# Patient Record
Sex: Male | Born: 1975 | Race: White | Hispanic: No | Marital: Married | Smoking: Former smoker
Health system: Southern US, Community
[De-identification: ages and names within clinical notes are randomized; demographics above are authoritative.]

## PROBLEM LIST (undated history)

## (undated) DIAGNOSIS — I639 Cerebral infarction, unspecified: Secondary | ICD-10-CM

## (undated) DIAGNOSIS — G709 Myoneural disorder, unspecified: Secondary | ICD-10-CM

## (undated) DIAGNOSIS — M1990 Unspecified osteoarthritis, unspecified site: Secondary | ICD-10-CM

## (undated) DIAGNOSIS — R011 Cardiac murmur, unspecified: Secondary | ICD-10-CM

## (undated) DIAGNOSIS — K219 Gastro-esophageal reflux disease without esophagitis: Secondary | ICD-10-CM

## (undated) DIAGNOSIS — N189 Chronic kidney disease, unspecified: Secondary | ICD-10-CM

## (undated) HISTORY — PX: OTHER SURGICAL HISTORY: SHX169

## (undated) HISTORY — DX: Gastro-esophageal reflux disease without esophagitis: K21.9

## (undated) HISTORY — DX: Chronic kidney disease, unspecified: N18.9

## (undated) HISTORY — DX: Myoneural disorder, unspecified: G70.9

## (undated) HISTORY — DX: Cerebral infarction, unspecified: I63.9

## (undated) HISTORY — PX: HERNIA REPAIR: SHX51

## (undated) HISTORY — PX: KNEE ARTHROSCOPY: SUR90

## (undated) HISTORY — DX: Unspecified osteoarthritis, unspecified site: M19.90

## (undated) HISTORY — PX: SPINE SURGERY: SHX786

## (undated) HISTORY — DX: Cardiac murmur, unspecified: R01.1

---

## 2011-03-01 ENCOUNTER — Other Ambulatory Visit: Payer: Self-pay | Admitting: Neurosurgery

## 2011-03-01 DIAGNOSIS — M5410 Radiculopathy, site unspecified: Secondary | ICD-10-CM

## 2011-03-01 DIAGNOSIS — M549 Dorsalgia, unspecified: Secondary | ICD-10-CM

## 2011-03-03 ENCOUNTER — Ambulatory Visit
Admission: RE | Admit: 2011-03-03 | Discharge: 2011-03-03 | Disposition: A | Discharge status: Home / Self Care | Payer: Medicare Other | Source: Ambulatory Visit | Attending: Neurosurgery | Admitting: Neurosurgery

## 2011-03-03 DIAGNOSIS — M549 Dorsalgia, unspecified: Secondary | ICD-10-CM

## 2011-03-03 DIAGNOSIS — M5410 Radiculopathy, site unspecified: Secondary | ICD-10-CM

## 2011-03-15 ENCOUNTER — Other Ambulatory Visit: Payer: Self-pay | Admitting: Neurosurgery

## 2011-03-15 DIAGNOSIS — M5416 Radiculopathy, lumbar region: Secondary | ICD-10-CM

## 2011-03-15 DIAGNOSIS — M549 Dorsalgia, unspecified: Secondary | ICD-10-CM

## 2011-03-17 ENCOUNTER — Ambulatory Visit
Admission: RE | Admit: 2011-03-17 | Discharge: 2011-03-17 | Disposition: A | Discharge status: Home / Self Care | Payer: Medicare Other | Source: Ambulatory Visit | Attending: Neurosurgery | Admitting: Neurosurgery

## 2011-03-17 DIAGNOSIS — M549 Dorsalgia, unspecified: Secondary | ICD-10-CM

## 2011-03-17 DIAGNOSIS — M5416 Radiculopathy, lumbar region: Secondary | ICD-10-CM

## 2011-05-13 ENCOUNTER — Ambulatory Visit (HOSPITAL_COMMUNITY)
Admission: RE | Admit: 2011-05-13 | Discharge: 2011-05-13 | Disposition: A | Discharge status: Home / Self Care | Payer: Medicare Other | Source: Ambulatory Visit | Attending: Neurosurgery | Admitting: Neurosurgery

## 2011-05-13 ENCOUNTER — Encounter (HOSPITAL_COMMUNITY)
Admission: RE | Admit: 2011-05-13 | Discharge: 2011-05-13 | Disposition: A | Discharge status: Home / Self Care | Payer: Medicare Other | Source: Ambulatory Visit | Attending: Neurosurgery | Admitting: Neurosurgery

## 2011-05-13 ENCOUNTER — Other Ambulatory Visit (HOSPITAL_COMMUNITY): Payer: Self-pay | Admitting: Neurosurgery

## 2011-05-13 DIAGNOSIS — Z01818 Encounter for other preprocedural examination: Secondary | ICD-10-CM | POA: Insufficient documentation

## 2011-05-13 DIAGNOSIS — R52 Pain, unspecified: Secondary | ICD-10-CM

## 2011-05-13 DIAGNOSIS — I10 Essential (primary) hypertension: Secondary | ICD-10-CM | POA: Insufficient documentation

## 2011-05-13 LAB — BASIC METABOLIC PANEL
BUN: 25 mg/dL — ABNORMAL HIGH (ref 6–23)
Calcium: 10.6 mg/dL — ABNORMAL HIGH (ref 8.4–10.5)
GFR calc Af Amer: 43 mL/min — ABNORMAL LOW (ref >60)
GFR calc non Af Amer: 35 mL/min — ABNORMAL LOW (ref >60)
Glucose, Bld: 84 mg/dL (ref 70–99)
Potassium: 4.6 mEq/L (ref 3.5–5.1)
Sodium: 141 mEq/L (ref 135–145)

## 2011-05-13 LAB — CBC
Hemoglobin: 14.6 g/dL (ref 13.0–17.0)
MCH: 31.2 pg (ref 26.0–34.0)
MCHC: 34.4 g/dL (ref 30.0–36.0)

## 2011-05-18 ENCOUNTER — Ambulatory Visit (HOSPITAL_COMMUNITY)
Admission: RE | Admit: 2011-05-18 | Discharge: 2011-05-19 | Disposition: A | Discharge status: Home / Self Care | Payer: Medicare Other | Source: Ambulatory Visit | Attending: Neurosurgery | Admitting: Neurosurgery

## 2011-05-18 ENCOUNTER — Observation Stay (HOSPITAL_COMMUNITY): Payer: Medicare Other

## 2011-05-18 ENCOUNTER — Ambulatory Visit (HOSPITAL_COMMUNITY): Admission: RE | Admit: 2011-05-18 | Payer: Medicare Other | Source: Ambulatory Visit | Admitting: Neurosurgery

## 2011-05-18 DIAGNOSIS — I10 Essential (primary) hypertension: Secondary | ICD-10-CM | POA: Insufficient documentation

## 2011-05-18 DIAGNOSIS — M79609 Pain in unspecified limb: Secondary | ICD-10-CM | POA: Insufficient documentation

## 2011-05-18 DIAGNOSIS — M5126 Other intervertebral disc displacement, lumbar region: Secondary | ICD-10-CM | POA: Insufficient documentation

## 2011-05-18 DIAGNOSIS — M47817 Spondylosis without myelopathy or radiculopathy, lumbosacral region: Secondary | ICD-10-CM | POA: Insufficient documentation

## 2011-05-18 DIAGNOSIS — M4120 Other idiopathic scoliosis, site unspecified: Secondary | ICD-10-CM | POA: Insufficient documentation

## 2011-05-18 DIAGNOSIS — Z01818 Encounter for other preprocedural examination: Secondary | ICD-10-CM | POA: Insufficient documentation

## 2011-05-18 DIAGNOSIS — F17200 Nicotine dependence, unspecified, uncomplicated: Secondary | ICD-10-CM | POA: Insufficient documentation

## 2011-05-18 DIAGNOSIS — M6250 Muscle wasting and atrophy, not elsewhere classified, unspecified site: Secondary | ICD-10-CM | POA: Insufficient documentation

## 2011-05-18 DIAGNOSIS — K219 Gastro-esophageal reflux disease without esophagitis: Secondary | ICD-10-CM | POA: Insufficient documentation

## 2011-05-18 DIAGNOSIS — J449 Chronic obstructive pulmonary disease, unspecified: Secondary | ICD-10-CM | POA: Insufficient documentation

## 2011-05-18 DIAGNOSIS — J4489 Other specified chronic obstructive pulmonary disease: Secondary | ICD-10-CM | POA: Insufficient documentation

## 2011-05-18 DIAGNOSIS — Z01812 Encounter for preprocedural laboratory examination: Secondary | ICD-10-CM | POA: Insufficient documentation

## 2011-05-20 NOTE — Op Note (Signed)
Clayton Livingston, Clayton Livingston                ACCOUNT NO.:  [id number]  MEDICAL RECORD NO.:  [id number]  LOCATION:  3599                         FACILITY:  [facility]  PHYSICIAN:  Hilda Lias, M.D.   DATE OF BIRTH:  08-29-1976  DATE OF PROCEDURE:  05/18/2011 DATE OF DISCHARGE:                              OPERATIVE REPORT   PREOPERATIVE DIAGNOSIS:  Right L2-L3 extraforaminal herniated disk with osteophyte and chronic radiculopathy with atrophy of the right quadriceps.  POSTOPERATIVE DIAGNOSIS:  Right L2-L3 extraforaminal herniated disk with osteophyte and chronic radiculopathy with atrophy of the right quadriceps.  PROCEDURE:  Right L2-L3 extraforaminal diskectomy, lysis of adhesion, removal of osteophyte, decompression of the L2 nerve root.  Microscope matrix.  SURGEON:  Hilda Lias, MD.  ASSISTANT:  Cristi Loron, MD.  CLINICAL HISTORY:  [name] is a gentleman who had been complaining of back pain worse in both and right leg pain for 10 years.  By the time I saw him, he has severe _wasted of the quadriceps_________ .  He has a bad case of scoliosis with degenerative disk disease.  X-rays showed multiple lesions, but the most important was a large herniated disk and a spondylosis of L2-L3 on the right side of the foramina.  The patient wanted to proceed with surgery.  DESCRIPTION OF PROCEDURE:  The patient was taken to the OR, and after intubation, he was positioned in prone manner.  The back was cleaned with DuraPrep.  Using the C-arm, we localized the area of L2-L3. Incision was made and dissection was carried down using the serial dilator down to the subcutaneous space, fascia, and muscle down to the L3-L4 space.  From then on, we took several x-rays just to be 100% sure. The main problem was, there was some mislabeling between the MRI from [facility] and the x-ray taken here in the OR.  What it was called L5 by x-ray, it was called S1 by MRI.  At the end, I talked  to the doctor on several occasion and then we agreed that the area where the problem was, was at the level of L2-3 based on the MRI and not in the x-ray taken in the OR.  That was probably the first abnormal disk counted from  below.  Nevertheless, after we went into the intertransverse space with the help of the microscope, we started to dissect the intertransverse muscle.  Immediately, we found that the L2 nerve root was flat, completely yellow, and really _pushed _________ anteriorly.  Dissection was carried out, and after we mobilized the L2 nerve root we found two things, one was a large osteophyte which had to be drilled out and after that, we found at least 4 fragments of herniated disk compromising the takeoff of the L2 nerve root and part of the thecal sac.  At the end, we had good decompression of the L2 nerve roots, good lateral diskectomy, with good lysis of adhesions.  Then, the area was irrigated. Surgifoam was left in the surgical area and the wound was closed with Vicryl and Steri-Strips.          ______________________________ Hilda Lias, M.D.     EB/MEDQ  D:  05/18/2011  T:  05/18/2011  Job:  [id number]  Electronically Signed by Hilda Lias M.D. on 05/20/2011 06:18:04 PM

## 2011-05-20 NOTE — H&P (Signed)
  NAMEJAELEN, SOTH                ACCOUNT NO.:  [id number]  MEDICAL RECORD NO.:  [id number]  LOCATION:  3599                         FACILITY:  [facility]  PHYSICIAN:  Hilda Lias, M.D.   DATE OF BIRTH:  Nov 17, 1975  DATE OF ADMISSION:  05/18/2011 DATE OF DISCHARGE:                             HISTORY & PHYSICAL   Mr. Routh is a gentleman who was seen in my office on several occasions starting on June of this year complaining of back pain radiation down to right knee, which is getting worse.  According to him, he told me that this problem has been going for more than 10 years and legs are getting painful, having borderline sensation.  He denies any pain in the left leg.  Pain is with walking up on the steps and also on standing.  He has an MRI and sent to [place] for evaluation.  PAST MEDICAL HISTORY:  He has a kidney transplant 3 times.  He is allergic to SULFA and _pnc_________.  He is taking Prograf, Norvasc, prednisone, Prilosec.  FAMILY HISTORY:  Sister has scoliosis.  SOCIAL HISTORY:  The patient does not smoke or drink.  REVIEW OF SYSTEMS:  He has a known history of kidney disease, having 3 kidney transplants.  He has a history of reflux, dizziness, pancreatitis, sinusitis, and tachycardia.  PHYSICAL EXAMINATION:  skin normal__________. EARS, NOSE, AND THROAT:  Normal. NECK:  Normal. LUNGS:  Clear. CARDIOVASCULAR:  Normal. ABDOMEN:  As described from previous surgery. MUSCULOSKELETAL:  __________. NEUROLOGIC:  He has _athrophy of the quadriceps_________.  There is absence of the right quadriceps reflex.  Numbness, which involve mostly the L2 area.  Reflexes symmetrical with absence of the right knee jerk.  Lumbar spine x-ray showed scoliosis compared to the right side, he has  scoliosis of L2-L3.  An MRI _showed a hnp at right l2l3_________ with an intraforaminal and extraforaminal disk.  CLINICAL IMPRESSION:  Scoliosis with right L2-L3 extraforaminal herniated disk.            ______________________________ Hilda Lias, M.D.     EB/MEDQ  D:  05/18/2011  T:  05/18/2011  Job:  [id number]  Electronically Signed by Hilda Lias M.D. on 05/20/2011 06:17:01 PM

## 2011-06-01 NOTE — Discharge Summary (Signed)
  NAMEHASHIM, EICHHORST                ACCOUNT NO.:  [id number]  MEDICAL RECORD NO.:  [id number]  LOCATION:  3534                         FACILITY:  [facility]  PHYSICIAN:  Hilda Lias, M.D.   DATE OF BIRTH:  01/15/1976  DATE OF ADMISSION:  05/18/2011 DATE OF DISCHARGE:  05/19/2011                              DISCHARGE SUMMARY   ADMISSION DIAGNOSES:  Right L2-L3 herniated disk with atrophy of the quadriceps.  Scoliosis.  FINAL DIAGNOSES:  Right L2-L3 herniated disk with atrophy of the quadriceps.  Scoliosis.  CLINICAL HISTORY:  The patient was admitted because of back pain raising to the right thigh associated with weakness of the quadriceps.  The patient says this problem has been going on for at least 2 years and he has a history of scoliosis.  Because of the findings, surgery was advised.  Laboratory normal.  COURSE IN THE HOSPITAL:  The patient was taken to surgery, right L2-L3 diskectomy was done.  The patient did well less than 24 hours, he wanted to go home.  CONDITION ON DISCHARGE:  Improvement.  MEDICATION:  Oxycodone, diazepam.  DIET:  Regular.  ACTIVITIES:  He is not to drive.  He is not to do any heavy lifting.  FOLLOWUP:  He will be seen in my office in 2 or 3 weeks.  He knows that if there is any problem he is to call me at anytime.          ______________________________ Hilda Lias, M.D.     EB/MEDQ  D:  05/20/2011  T:  05/21/2011  Job:  [id number]  Electronically Signed by Hilda Lias M.D. on 06/01/2011 07:19:32 PM

## 2012-04-16 ENCOUNTER — Encounter: Payer: Self-pay | Admitting: Physical Medicine & Rehabilitation

## 2012-05-03 ENCOUNTER — Ambulatory Visit: Payer: Medicare Other | Admitting: Physical Medicine & Rehabilitation

## 2012-05-14 ENCOUNTER — Ambulatory Visit: Payer: PRIVATE HEALTH INSURANCE | Admitting: Physical Medicine & Rehabilitation

## 2012-05-25 ENCOUNTER — Encounter: Payer: Medicare Other | Attending: Physical Medicine & Rehabilitation

## 2012-05-25 ENCOUNTER — Encounter: Payer: Self-pay | Admitting: Physical Medicine & Rehabilitation

## 2012-05-25 ENCOUNTER — Other Ambulatory Visit: Payer: Self-pay | Admitting: Physical Medicine and Rehabilitation

## 2012-05-25 ENCOUNTER — Ambulatory Visit (HOSPITAL_BASED_OUTPATIENT_CLINIC_OR_DEPARTMENT_OTHER): Payer: Medicare Other | Admitting: Physical Medicine & Rehabilitation

## 2012-05-25 VITALS — BP 161/114 | HR 104 | Resp 14 | Ht 66.0 in | Wt 171.2 lb

## 2012-05-25 DIAGNOSIS — M47817 Spondylosis without myelopathy or radiculopathy, lumbosacral region: Secondary | ICD-10-CM | POA: Insufficient documentation

## 2012-05-25 DIAGNOSIS — M545 Low back pain, unspecified: Secondary | ICD-10-CM | POA: Insufficient documentation

## 2012-05-25 DIAGNOSIS — M51379 Other intervertebral disc degeneration, lumbosacral region without mention of lumbar back pain or lower extremity pain: Secondary | ICD-10-CM | POA: Insufficient documentation

## 2012-05-25 DIAGNOSIS — M419 Scoliosis, unspecified: Secondary | ICD-10-CM | POA: Insufficient documentation

## 2012-05-25 DIAGNOSIS — M4120 Other idiopathic scoliosis, site unspecified: Secondary | ICD-10-CM | POA: Insufficient documentation

## 2012-05-25 DIAGNOSIS — N189 Chronic kidney disease, unspecified: Secondary | ICD-10-CM | POA: Insufficient documentation

## 2012-05-25 DIAGNOSIS — G8929 Other chronic pain: Secondary | ICD-10-CM

## 2012-05-25 DIAGNOSIS — Z8673 Personal history of transient ischemic attack (TIA), and cerebral infarction without residual deficits: Secondary | ICD-10-CM | POA: Insufficient documentation

## 2012-05-25 DIAGNOSIS — M109 Gout, unspecified: Secondary | ICD-10-CM | POA: Insufficient documentation

## 2012-05-25 DIAGNOSIS — K219 Gastro-esophageal reflux disease without esophagitis: Secondary | ICD-10-CM | POA: Insufficient documentation

## 2012-05-25 DIAGNOSIS — M5137 Other intervertebral disc degeneration, lumbosacral region: Secondary | ICD-10-CM | POA: Insufficient documentation

## 2012-05-25 DIAGNOSIS — M79609 Pain in unspecified limb: Secondary | ICD-10-CM | POA: Insufficient documentation

## 2012-05-25 DIAGNOSIS — M533 Sacrococcygeal disorders, not elsewhere classified: Secondary | ICD-10-CM

## 2012-05-25 DIAGNOSIS — Z5181 Encounter for therapeutic drug level monitoring: Secondary | ICD-10-CM

## 2012-05-25 NOTE — Patient Instructions (Signed)
You have a lumbar scoliosis The disc problem you have at L2-L3 is much higher than your current pain complaints I am doing a sacroiliac injection next month to further assess the cause of the low back pain on the right side. You must get a one-week supply of your oxycodone if you wish to continue this medicine from Dr. Jeral Fruit. I will review your urine drug screen and decide whether we can take over this prescription.

## 2012-05-25 NOTE — Progress Notes (Addendum)
Subjective:    Patient ID: Clayton Livingston, male    DOB: [birth year], 36 y.o.   MRN: [id number]  HPI Several year history of low back pain as well as right lower extremity pain. He was initially seen by his primary care physician and then referred to neurosurgery. He had 2 lumbar injections which were not helpful in 2012. He then underwent a right L2-L3 extraforaminal discectomy. This gave him some temporary relief but not long-term relief. He has been evaluated by the spine Center at [facility]. He was advised to followup with his local neurosurgeon. Past medical history is significant for renal failure and has undergone renal transplant x3 In addition the patient has gout which is unusually severe. He is on Krystexxa infusion every 2 weeks which also may affect healing from a more extensive spinal surgery therefore a more definitive procedure for his scoliosis has not been performed   Pain Inventory Average Pain 7 Pain Right Now 8 My pain is constant and stabbing  In the last 24 hours, has pain interfered with the following? General activity 10 Relation with others 8 Enjoyment of life 10 What TIME of day is your pain at its worst? morning Sleep (in general) Poor  Pain is worse with: walking and bending Pain improves with: medication Relief from Meds: 7  Mobility use a cane how many minutes can you walk? 3-4 ability to climb steps?  no do you drive?  no  Function disabled: date disabled 2010 I need assistance with the following:  dressing, bathing, meal prep, household duties and shopping  Neuro/Psych weakness numbness tingling trouble walking spasms  Prior Studies Any changes since last visit?  no  New patient  Physicians involved in your care Rheumatologist Dr Leta Jungling [facility]) Neurosurgeon Dr Eliane Decree Orthopedist Dr Donnie [facility]) Joeseph Amor kidney dr in [facility]   History reviewed. No pertinent family history. History   Social History  .  Marital Status: Married    Spouse Name: N/A    Number of Children: N/A  . Years of Education: N/A   Social History Main Topics  . Smoking status: Former Smoker -- 0.2 packs/day for 25 years    Types: Cigarettes    Quit date: 12/24/2011  . Smokeless tobacco: Current User    Types: Snuff  . Alcohol Use: None  . Drug Use: None  . Sexually Active: None   Other Topics Concern  . None   Social History Narrative  . None   Past Surgical History  Procedure Date  . Kidney transplant x 3   . Hernia repair   . Knee arthroscopy   . Spine surgery    Past Medical History  Diagnosis Date  . Neuromuscular disorder   . Heart murmur     bicuspid valve disorder with enlarged aorta  . Stroke   . GERD (gastroesophageal reflux disease)   . 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 Arthritis     severe gout   BP 161/114  Pulse 104  Resp 14  Ht 5\' 6"  (1.676 m)  Wt 171 lb 3.2 oz (77.656 kg)  BMI 27.63 kg/m2  SpO2 100%   Review of Systems  Constitutional: Positive for diaphoresis, appetite change and unexpected weight change.  Respiratory: Positive for shortness of breath and wheezing.   Cardiovascular: Positive for leg swelling.  Gastrointestinal: Positive for abdominal pain.  Genitourinary: Positive for difficulty urinating.  Musculoskeletal:  Positive for arthralgias and gait problem.  Neurological: Positive for weakness and numbness.       Tingling  Hematological: Bruises/bleeds easily.  All other systems reviewed and are negative.       Objective:   Physical Exam  Constitutional: He is oriented to person, place, and time. He appears well-developed.       Appears older than stated age  Neck: Normal range of motion.  Musculoskeletal:       Multiple areas of gouty arthritis involving both hands including MCPs PIPs and DIPs. Also extensor surface of the left elbow also first MTPs bilateral lower extremity is.  Lumbar spine has  evidence of scoliosis in the lower lumbar area with convexity toward the left   Straight leg raising test is negative.  Neurological: He is alert and oriented to person, place, and time. He has normal strength. No sensory deficit. Coordination and gait normal.  Reflex Scores:      Tricep reflexes are 2+ on the right side and 2+ on the left side.      Bicep reflexes are 2+ on the right side and 2+ on the left side.      Brachioradialis reflexes are 2+ on the right side and 2+ on the left side.      Patellar reflexes are 2+ on the right side and 2+ on the left side.      Achilles reflexes are 2+ on the right side and 2+ on the left side. Psychiatric: He has a normal mood and affect.          Assessment & Plan:  1. Lumbar pain with spondylosis and scoliosis. He also has severe degenerative disc at L2-L3 however his pain is mainly right-sided and in the L4-L5 and S1 area. He does have some PSIS tenderness which would suggest SI pain. We'll set him up for SI injection Urine drug screen today. After the patient left nursing reported that the patient left an empty container with wet urine smelling residue. Patient also left some of his medications. When he returns today he will need to repeat his urine drug screen.   Reviewed [facility] controlled substance report. He has had multiple providers describing narcotic analgesics. I do not feel comfortable prescribing narcotic analgesics in this case but could provide spinal injections should the patient wished to pursue this route. The patient may benefit from a [facility] evaluation  I have offered to write tramadol in the meantime but he states "" makes him sick

## 2012-05-28 ENCOUNTER — Telehealth: Payer: Self-pay | Admitting: Physical Medicine & Rehabilitation

## 2012-05-28 NOTE — Telephone Encounter (Signed)
Dr Jeral Fruit not in office on Friday to refill meds, not having any luck today  Either.  Is there anything we can do?  Feels like he may need to go to the ED.  Please call.

## 2012-05-28 NOTE — Telephone Encounter (Signed)
Patient advised to go to ED and also told that we will not be prescribing narcotics.  Patient understands.

## 2012-06-07 ENCOUNTER — Ambulatory Visit: Payer: Medicare Other | Admitting: Physical Medicine & Rehabilitation

## 2012-06-12 ENCOUNTER — Ambulatory Visit: Payer: Medicare Other | Admitting: Physical Medicine & Rehabilitation

## 2013-04-02 IMAGING — CR DG CHEST 2V
2 series · 2 of 2 positions shown · non-contrast
Comparison: Portable chest x-ray of 11/09/2010

CLINICAL DATA: Preop for back surgery, hypertension

CHEST - 2 VIEW

[view not recorded (1 of 2)]
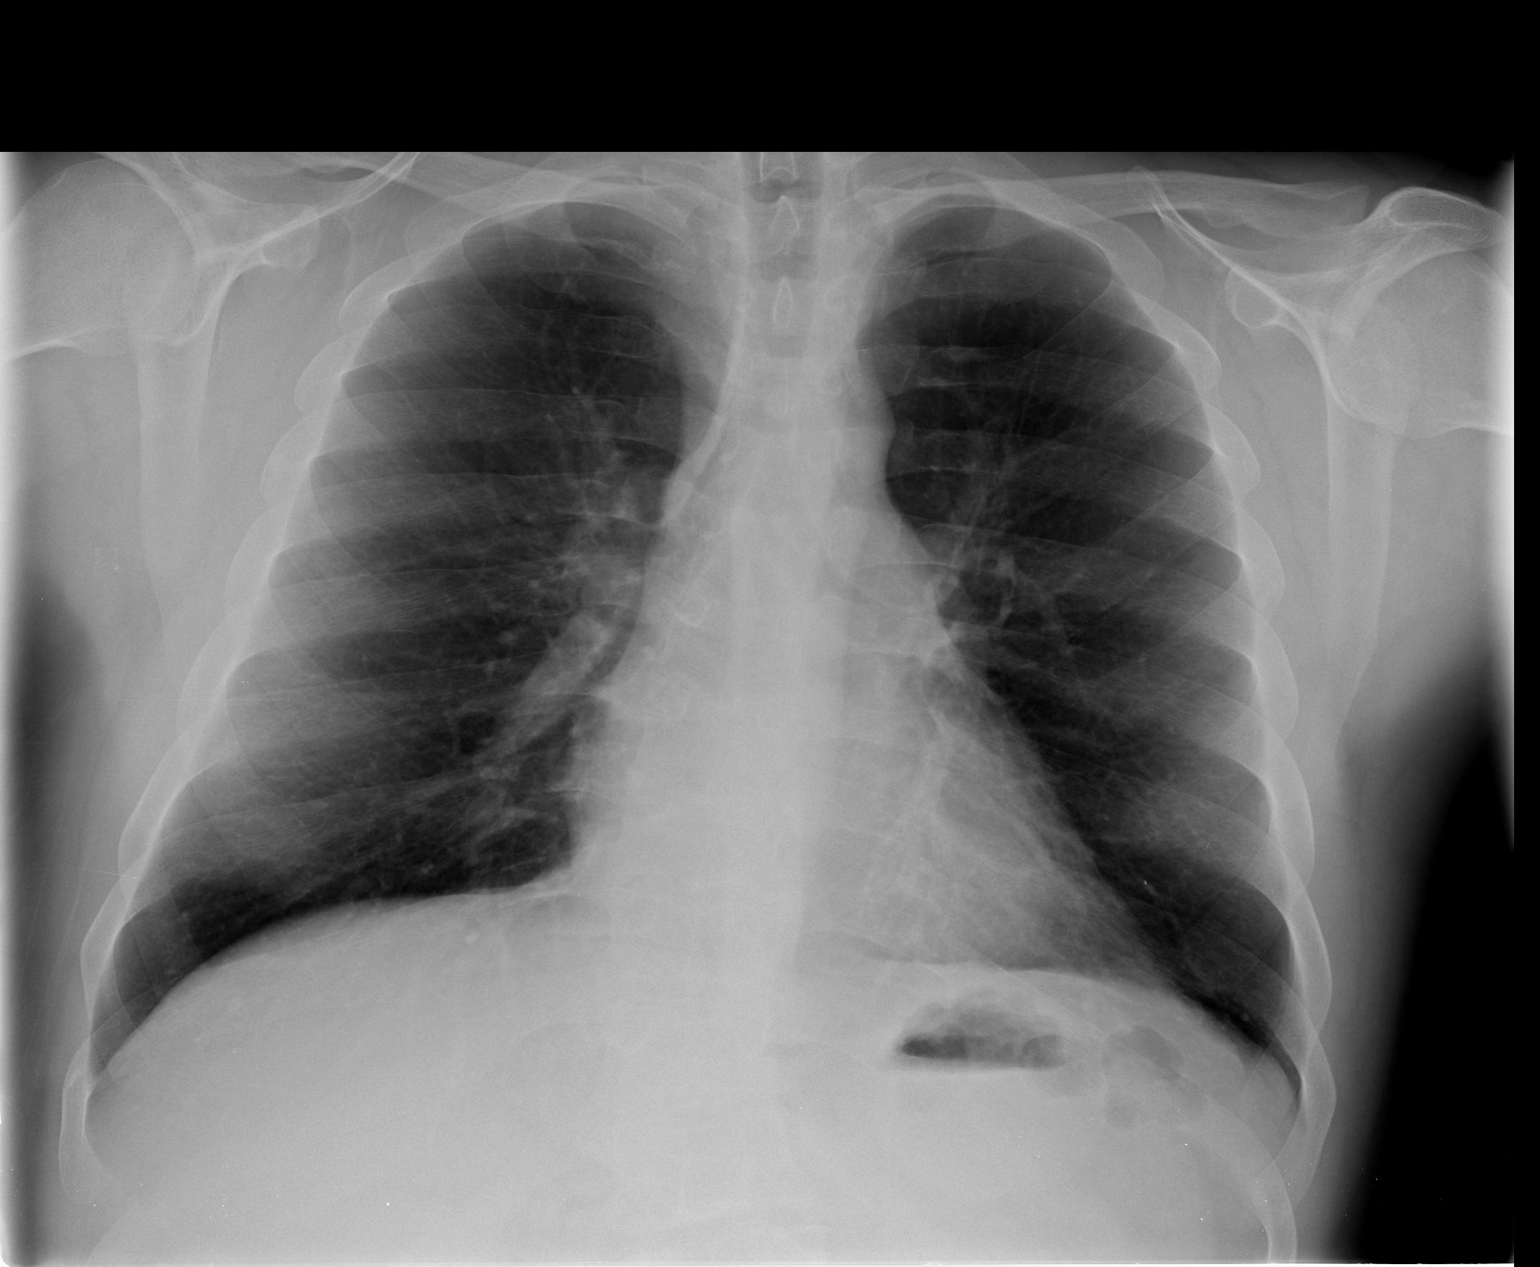

[view not recorded (2 of 2)]
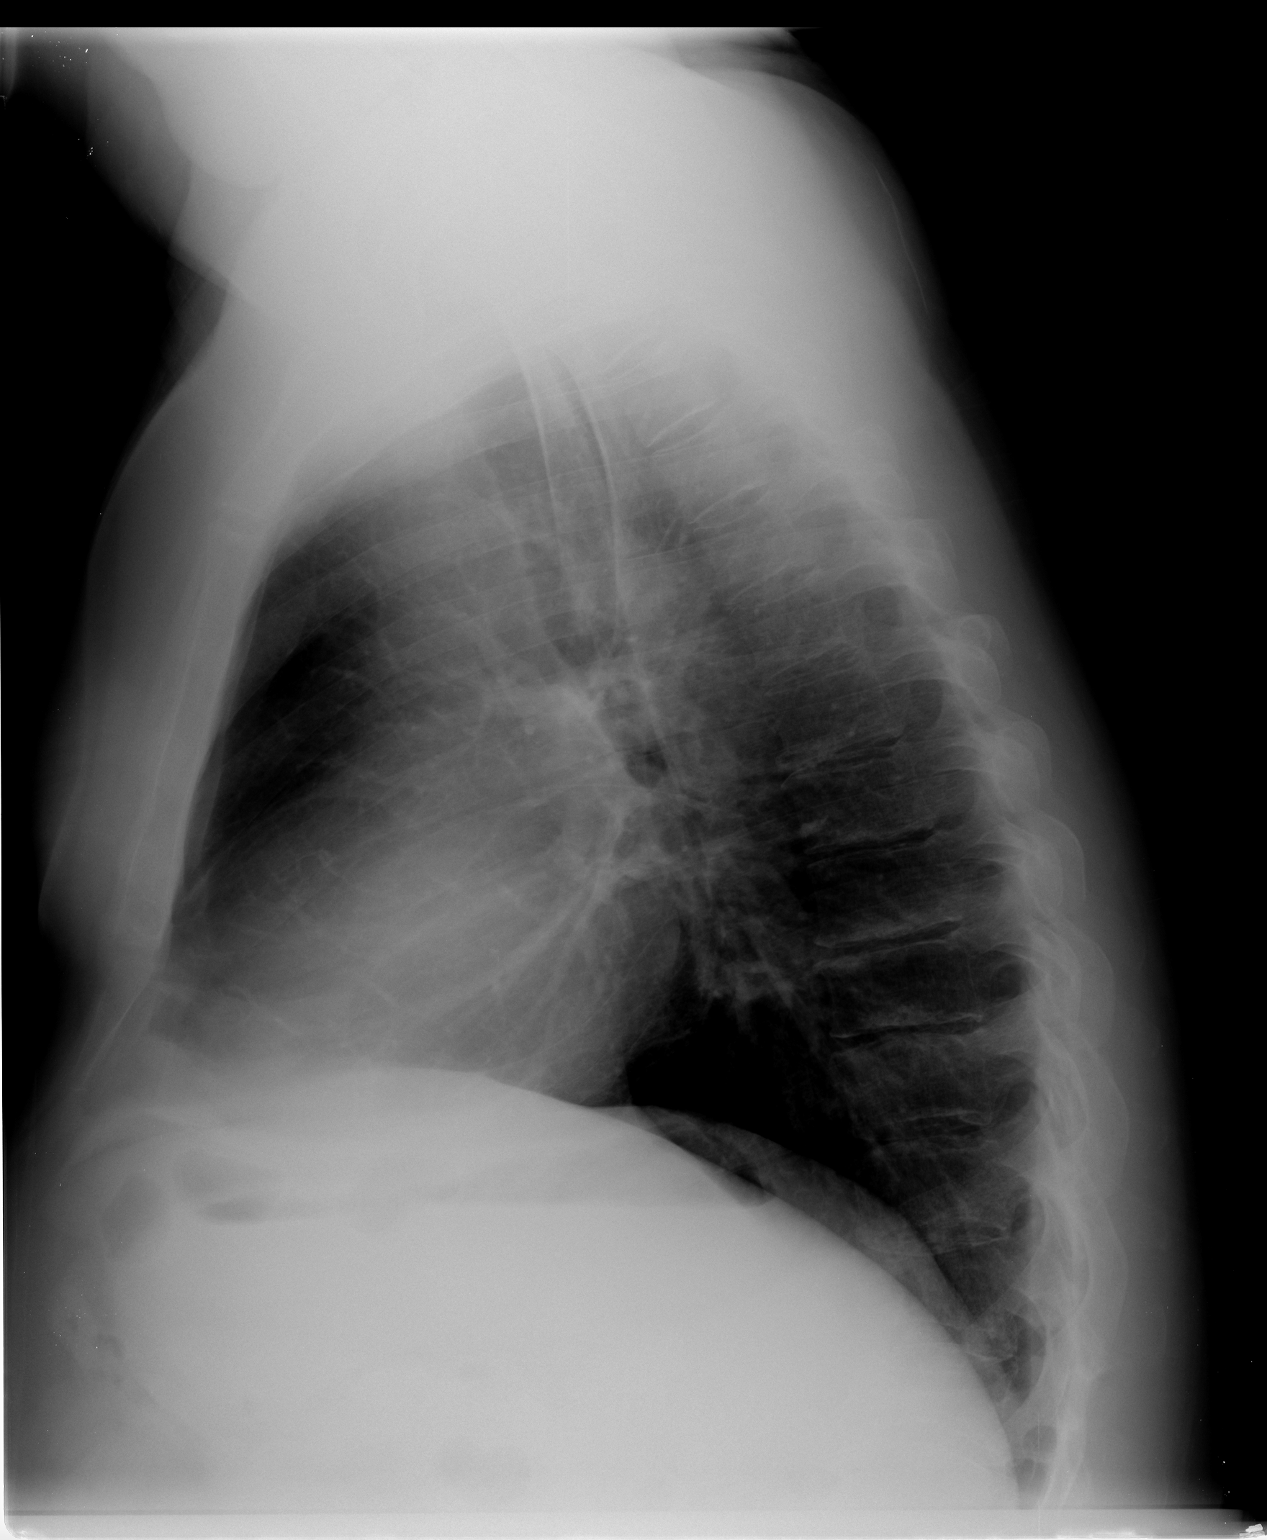

[2 of 2 positions shown; findings below may reference images not displayed]

FINDINGS: The lungs are clear.  Mediastinal contours appear normal.
The heart is within normal limits in size.  No acute bony
abnormality is seen.
IMPRESSION: No active lung disease.

## 2016-08-19 ENCOUNTER — Other Ambulatory Visit: Payer: Self-pay | Admitting: *Deleted

## 2016-08-19 DIAGNOSIS — N186 End stage renal disease: Secondary | ICD-10-CM

## 2016-08-19 DIAGNOSIS — Z01810 Encounter for preprocedural cardiovascular examination: Secondary | ICD-10-CM

## 2016-09-01 ENCOUNTER — Encounter: Payer: Self-pay | Admitting: Vascular Surgery

## 2016-09-05 ENCOUNTER — Encounter (HOSPITAL_COMMUNITY): Payer: Medicare Other

## 2016-09-05 ENCOUNTER — Other Ambulatory Visit (HOSPITAL_COMMUNITY): Payer: Medicare Other

## 2016-09-07 ENCOUNTER — Encounter: Payer: Medicare Other | Admitting: Vascular Surgery

## 2016-09-19 DEATH — deceased

## 2016-10-17 ENCOUNTER — Telehealth: Payer: Self-pay | Admitting: Vascular Surgery

## 2016-10-17 ENCOUNTER — Inpatient Hospital Stay (HOSPITAL_COMMUNITY): Admission: RE | Admit: 2016-10-17 | Payer: Medicare Other | Source: Ambulatory Visit

## 2016-10-17 ENCOUNTER — Other Ambulatory Visit (HOSPITAL_COMMUNITY): Payer: Medicare Other

## 2016-10-17 NOTE — Telephone Encounter (Signed)
Pt's wife called stating pt passed away on September 03, 2016. He has appointments today and on the 01.31.2018 with Dr. Edilia Boickson.Marland Kitchen..Marland Kitchen

## 2016-10-19 ENCOUNTER — Encounter: Payer: Medicare Other | Admitting: Vascular Surgery
